# Patient Record
Sex: Male | Born: 2005 | Race: Black or African American | Hispanic: No | Marital: Single | Smoking: Current every day smoker
Health system: Southern US, Community
[De-identification: ages and names within clinical notes are randomized; demographics above are authoritative.]

## PROBLEM LIST (undated history)

## (undated) DIAGNOSIS — J45909 Unspecified asthma, uncomplicated: Secondary | ICD-10-CM

## (undated) HISTORY — PX: TONSILLECTOMY: SUR1361

---
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
## 2023-03-01 ENCOUNTER — Emergency Department: Payer: Medicaid Other

## 2023-03-01 ENCOUNTER — Other Ambulatory Visit: Payer: Self-pay

## 2023-03-01 ENCOUNTER — Emergency Department
Admission: EM | Admit: 2023-03-01 | Discharge: 2023-03-01 | Disposition: A | Discharge status: Home / Self Care | Payer: Medicaid Other | Source: Home / Self Care | Attending: Emergency Medicine | Admitting: Emergency Medicine

## 2023-03-01 DIAGNOSIS — M7989 Other specified soft tissue disorders: Secondary | ICD-10-CM | POA: Insufficient documentation

## 2023-03-01 DIAGNOSIS — M79671 Pain in right foot: Secondary | ICD-10-CM | POA: Diagnosis present

## 2023-03-01 MED ORDER — MELOXICAM 15 MG PO TABS: 15.0000 mg | ORAL_TABLET | Freq: Every day | ORAL | 0 refills | Status: AC

## 2023-03-01 NOTE — ED Triage Notes (Signed)
C/o waking up today and right foot pain and swelling. Denies injury.  Ambulatory. NAD

## 2023-03-01 NOTE — ED Provider Notes (Signed)
[facility] Provider Note    Event Date/Time   First MD Initiated Contact with Patient 03/01/23 1120     (approximate)   History   Foot Problem   HPI  Derrick Whitaker is a 17 y.o. male with no significant past medical history presents emergency department for right foot pain.  Patient states pain and swelling started last night.  No known injury.  States swelling is at the medial surface on the plantar side of the foot.      Physical Exam   Triage Vital Signs: ED Triage Vitals  Encounter Vitals Group     BP 03/01/23 1027 116/82     Systolic BP Percentile --      Diastolic BP Percentile --      Pulse Rate 03/01/23 1027 71     Resp 03/01/23 1027 16     Temp 03/01/23 1027 98.7 F (37.1 C)     Temp Source 03/01/23 1027 Oral     SpO2 03/01/23 1027 98 %     Weight 03/01/23 1028 154 lb (69.9 kg)     Height 03/01/23 1028 5\' 3"  (1.6 m)     Head Circumference --      Peak Flow --      Pain Score 03/01/23 1046 5     Pain Loc --      Pain Education --      Exclude from Growth Chart --     Most recent vital signs: Vitals:   03/01/23 1027  BP: 116/82  Pulse: 71  Resp: 16  Temp: 98.7 F (37.1 C)  SpO2: 98%     General: Awake, no distress.   CV:  Good peripheral perfusion. regular rate and  rhythm Resp:  Normal effort.  Abd:  No distention.   Other:  Right foot with swelling along the medial aspect and plantar surface, mostly in the arch, area is tender to palpation, no bruising noted   ED Results / Procedures / Treatments   Labs (all labs ordered are listed, but only abnormal results are displayed) Labs Reviewed - No data to display   EKG     RADIOLOGY X-ray of the right foot    PROCEDURES:   Procedures   MEDICATIONS ORDERED IN ED: Medications - No data to display   IMPRESSION / MDM / ASSESSMENT AND PLAN / ED COURSE  I reviewed the triage vital signs and the nursing notes.                              Differential  diagnosis includes, but is not limited to, sprain, cellulitis, tendinitis, fracture  Patient's presentation is most consistent with acute complicated illness / injury requiring diagnostic workup.   X-ray of the right foot   X-ray of the right foot independently reviewed interpreted by me as being negative for any acute abnormality  I did explain the findings to the patient.  He is to follow-up with podiatry.  He was placed in Ace wrap and a postop shoe.  Take meloxicam daily for pain and inflammation.  Patient is in agreement treatment plan.  He was discharged stable condition.   FINAL CLINICAL IMPRESSION(S) / ED DIAGNOSES   Final diagnoses:  Right foot pain     Rx / DC Orders   ED Discharge Orders          Ordered    meloxicam (MOBIC) 15 MG tablet  Daily  03/01/23 1238             Note:  This document was prepared using Dragon voice recognition software and may include unintentional dictation errors.    Faythe Ghee, PA-C 03/01/23 1242    Janith Lima, MD 03/02/23 [phone]

## 2023-03-01 NOTE — Discharge Instructions (Signed)
Follow-up podiatry.  Please call for an appointment.  Take medication as prescribed.  Return if worsening

## 2023-06-11 ENCOUNTER — Other Ambulatory Visit: Payer: Self-pay

## 2023-06-11 ENCOUNTER — Emergency Department: Payer: Medicaid Other

## 2023-06-11 ENCOUNTER — Emergency Department
Admission: EM | Admit: 2023-06-11 | Discharge: 2023-06-11 | Disposition: A | Discharge status: Home / Self Care | Payer: Medicaid Other | Attending: Emergency Medicine | Admitting: Emergency Medicine

## 2023-06-11 DIAGNOSIS — J45909 Unspecified asthma, uncomplicated: Secondary | ICD-10-CM | POA: Diagnosis not present

## 2023-06-11 DIAGNOSIS — F17290 Nicotine dependence, other tobacco product, uncomplicated: Secondary | ICD-10-CM | POA: Diagnosis not present

## 2023-06-11 DIAGNOSIS — R0789 Other chest pain: Secondary | ICD-10-CM | POA: Insufficient documentation

## 2023-06-11 LAB — CBC
HCT: 40.6 % (ref 39.0–52.0)
Hemoglobin: 14.5 g/dL (ref 13.0–17.0)
MCH: 29.9 pg (ref 26.0–34.0)
MCHC: 35.7 g/dL (ref 30.0–36.0)
MCV: 83.7 fL (ref 80.0–100.0)
Platelets: 410 10*3/uL — ABNORMAL HIGH (ref 150–400)
RBC: 4.85 MIL/uL (ref 4.22–5.81)
RDW: 12.7 % (ref 11.5–15.5)
WBC: 5.6 10*3/uL (ref 4.0–10.5)
nRBC: 0 % (ref 0.0–0.2)

## 2023-06-11 LAB — BASIC METABOLIC PANEL
Anion gap: 9 (ref 5–15)
BUN: 15 mg/dL (ref 6–20)
CO2: 24 mmol/L (ref 22–32)
Calcium: 9 mg/dL (ref 8.9–10.3)
Chloride: 104 mmol/L (ref 98–111)
Creatinine, Ser: 1.16 mg/dL (ref 0.61–1.24)
GFR, Estimated: >60 mL/min (ref >60)
Glucose, Bld: 127 mg/dL — ABNORMAL HIGH (ref 70–99)
Potassium: 3.5 mmol/L (ref 3.5–5.1)
Sodium: 137 mmol/L (ref 135–145)

## 2023-06-11 LAB — TROPONIN I (HIGH SENSITIVITY): Troponin I (High Sensitivity): 3 ng/L (ref <18)

## 2023-06-11 MED ORDER — NAPROXEN 500 MG PO TABS
500.0000 mg | ORAL_TABLET | Freq: Once | ORAL | Status: AC
  Administered 2023-06-11: 500 mg via ORAL
  Filled 2023-06-11: qty 1

## 2023-06-11 MED ORDER — NAPROXEN 500 MG PO TABS: 500.0000 mg | ORAL_TABLET | Freq: Two times a day (BID) | ORAL | 0 refills | Status: DC

## 2023-06-11 NOTE — ED Provider Notes (Signed)
[facility] Provider Note    Event Date/Time   First MD Initiated Contact with Patient 06/11/23 1803     (approximate)   History   Chest Pain   HPI  Derrick Whitaker is a 17 y.o. male   presents to the ED with complaint of chest pain for approximately 2 weeks off-and-on.  Patient states that pain increased with bending over to feed the dogs this morning.  He denies any shortness of breath and currently does not have any pain.  He does admit to smoking approximately 3 cigars/week.  No recreational drugs are used.  He has a history of asthma but does not use an inhaler.  No shortness of breath, indigestion, dizziness or difficulty breathing noted with his chest pain.      Physical Exam   Triage Vital Signs: ED Triage Vitals  Encounter Vitals Group     BP 06/11/23 1621 123/77     Systolic BP Percentile --      Diastolic BP Percentile --      Pulse Rate 06/11/23 1621 86     Resp 06/11/23 1621 12     Temp 06/11/23 1621 98.9 F (37.2 C)     Temp src --      SpO2 06/11/23 1621 98 %     Weight 06/11/23 1631 165 lb (74.8 kg)     Height 06/11/23 1631 5\' 3"  (1.6 m)     Head Circumference --      Peak Flow --      Pain Score 06/11/23 1630 4     Pain Loc --      Pain Education --      Exclude from Growth Chart --     Most recent vital signs: Vitals:   06/11/23 1621  BP: 123/77  Pulse: 86  Resp: 12  Temp: 98.9 F (37.2 C)  SpO2: 98%     General: Awake, no distress.  She is able to talk in complete sentences without any noticeable shortness of breath. CV:  Good peripheral perfusion.  Heart regular rate and rhythm. Resp:  Normal effort.  Lungs are clear bilaterally.  No point tenderness on palpation of the ribs or sternum.  No reproduction of pain with exertion of the upper extremities. Abd:  No distention.  Soft, nontender, bowel sounds present x 4 quadrants. Other:     ED Results / Procedures / Treatments   Labs (all labs ordered are listed, but  only abnormal results are displayed) Labs Reviewed  BASIC METABOLIC PANEL - Abnormal; Notable for the following components:      Result Value   Glucose, Bld 127 (*)    All other components within normal limits  CBC - Abnormal; Notable for the following components:   Platelets 410 (*)    All other components within normal limits  TROPONIN I (HIGH SENSITIVITY)     EKG  Vent. rate 84 BPM PR interval 134 ms QRS duration 82 ms QT/QTcB 318/375 ms P-R-T axes 67 34 52 Normal sinus rhythm with sinus arrhythmia Normal ECG When compared with ECG of 23-Nov-2022 15:10,    RADIOLOGY Chest x-ray images reviewed and interpreted by myself independent of the radiologist and was negative for acute cardiopulmonary changes.    PROCEDURES:  Critical Care performed:   Procedures   MEDICATIONS ORDERED IN ED: Medications  naproxen (NAPROSYN) tablet 500 mg (has no administration in time range)     IMPRESSION / MDM / ASSESSMENT AND PLAN / ED  COURSE  I reviewed the triage vital signs and the nursing notes.   Differential diagnosis includes, but is not limited to, chest wall pain, cardiac event, asthma,  pneumonia, musculoskeletal pain.  17 year old male presents to the ED with complaint of anterior chest wall pain intermittently for approximately 2 weeks.  Patient denies any injury or shortness of breath.  No over-the-counter medications have been taken.  Lab work including troponin was reassuring.  Chest x-ray and EKG as noted above.  I explained to patient that we would try an anti-inflammatory to see if this helps with his musculoskeletal pain.  He is also encouraged to follow-up with a PCP of his choice, urgent care and a list of clinics was on his discharge papers so that he can obtain a PCP.  He is return to the emergency department if any severe worsening of his symptoms.      Patient's presentation is most consistent with acute illness / injury with system symptoms.  FINAL  CLINICAL IMPRESSION(S) / ED DIAGNOSES   Final diagnoses:  Anterior chest wall pain     Rx / DC Orders   ED Discharge Orders          Ordered    naproxen (NAPROSYN) 500 MG tablet  2 times daily with meals        06/11/23 1848    Ambulatory Referral to Primary Care (Establish Care)        06/11/23 1856             Note:  This document was prepared using Dragon voice recognition software and may include unintentional dictation errors.   Derrick Rumps, PA-C 06/11/23 Derrick Bickers, MD 06/11/23 [phone]

## 2023-06-11 NOTE — Discharge Instructions (Addendum)
A list of primary care providers clinics were listed on your discharge papers and a referral was also made.  A prescription for naproxen was sent to the pharmacy for you begin taking twice a day with food.  Return to the emergency department if any severe worsening of your symptoms or urgent concerns.  Lab work, EKG and chest x-ray today does not show any obvious signs that this is heart related but should be followed up in office.  Please go to the following website to schedule new (and existing) patient appointments:   [email or web address]   The following is a list of primary care offices in the area who are accepting new patients at this time.  Please reach out to one of them directly and let them know you would like to schedule an appointment to follow up on an Emergency Department visit, and/or to establish a new primary care provider (PCP).  There are likely other primary care clinics in the are who are accepting new patients, but this is an excellent place to start:  [facility] Lead physician: Dr Shirlee Latch [address] [place], [place] [address] [phone]  [facility] Lead Physician: Dr Alba Cory [address], [place], [place] [address] [phone]  [facility]  Lead Physician: Dr Olevia Perches [address] [place], [place] [address] [phone]  [facility] Lead Physician: Dr Sofie Hartigan [address], [place], [place] [address] [phone]  [facility] Lead Physician: Dr Bari Edward [address] [place], [place], [place] [address] [phone]

## 2023-06-11 NOTE — ED Triage Notes (Signed)
Pt ambulatory to triage for CP which has been going on for about 1-2 weeks. States he bent over to feed dogs this morning and pain increased. Denies N/V. Hx of HTN.

## 2023-11-01 ENCOUNTER — Emergency Department

## 2023-11-01 ENCOUNTER — Emergency Department: Admission: EM | Admit: 2023-11-01 | Discharge: 2023-11-01 | Disposition: A | Discharge status: Home / Self Care

## 2023-11-01 ENCOUNTER — Other Ambulatory Visit: Payer: Self-pay

## 2023-11-01 DIAGNOSIS — M25522 Pain in left elbow: Secondary | ICD-10-CM | POA: Diagnosis not present

## 2023-11-01 DIAGNOSIS — M25531 Pain in right wrist: Secondary | ICD-10-CM | POA: Insufficient documentation

## 2023-11-01 DIAGNOSIS — H1132 Conjunctival hemorrhage, left eye: Secondary | ICD-10-CM | POA: Diagnosis not present

## 2023-11-01 MED ORDER — CYCLOBENZAPRINE HCL 10 MG PO TABS
5.0000 mg | ORAL_TABLET | Freq: Once | ORAL | Status: AC
  Administered 2023-11-01: 5 mg via ORAL
  Filled 2023-11-01: qty 1

## 2023-11-01 MED ORDER — CYCLOBENZAPRINE HCL 5 MG PO TABS: 5.0000 mg | ORAL_TABLET | Freq: Three times a day (TID) | ORAL | 0 refills | Status: DC | PRN

## 2023-11-01 MED ORDER — KETOROLAC TROMETHAMINE 30 MG/ML IJ SOLN
30.0000 mg | Freq: Once | INTRAMUSCULAR | Status: AC
  Administered 2023-11-01: 30 mg via INTRAMUSCULAR
  Filled 2023-11-01: qty 1
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
## 2023-11-01 NOTE — ED Notes (Signed)
 Pt back from CT. XR at bedside. Provided ice pack per request

## 2023-11-01 NOTE — ED Notes (Addendum)
 Pt to CT at this time via wheelchair.

## 2023-12-27 ENCOUNTER — Emergency Department
Admission: EM | Admit: 2023-12-27 | Discharge: 2023-12-27 | Disposition: A | Discharge status: Home / Self Care | Attending: Emergency Medicine | Admitting: Emergency Medicine

## 2023-12-27 ENCOUNTER — Other Ambulatory Visit: Payer: Self-pay

## 2023-12-27 DIAGNOSIS — M7918 Myalgia, other site: Secondary | ICD-10-CM | POA: Diagnosis not present

## 2023-12-27 DIAGNOSIS — M25531 Pain in right wrist: Secondary | ICD-10-CM | POA: Diagnosis present

## 2023-12-27 NOTE — Discharge Instructions (Addendum)
 A referral for a primary care provider was requested for outpatient services.  Someone from the hospital may call to get more information from you.  Also the name of the rheumatologist is listed on your discharge papers.  Call Monday to see when you can get an appointment.  At this time continue the meloxicam  that you have at home.  Discontinue the naproxen .

## 2023-12-27 NOTE — ED Triage Notes (Signed)
 Pt to ED POV for bilateral wrist pain since 6/13 and L elbow pain since sometimes before. Has been seen for same elbow pain before. Pt in NAD.

## 2023-12-27 NOTE — ED Provider Notes (Signed)
 [facility] Provider Note    Event Date/Time   First MD Initiated Contact with Patient 12/27/23 1305     (approximate)   History   Wrist Pain and Elbow Pain   HPI  Derrick Whitaker is a 18 y.o. male   presents to the ED with complaint of bilateral wrist pain since 12/25/2023 along with left elbow pain.  Patient denies any injury.  He also occasionally has some pain to his right ankle.  He has been seen in the emergency department for similar complaints and unknown 11/01/2023 he was seen in the emergency department at which time his right wrist and left elbow were x-rayed.  Patient states that Tylenol  helps with his pain for short period of time.  He is not aware of any family members with rheumatoid, connective tissue disease or other muscle diseases.      Physical Exam   Triage Vital Signs: ED Triage Vitals  Encounter Vitals Group     BP 12/27/23 1220 126/82     Girls Systolic BP Percentile --      Girls Diastolic BP Percentile --      Boys Systolic BP Percentile --      Boys Diastolic BP Percentile --      Pulse Rate 12/27/23 1220 84     Resp 12/27/23 1220 16     Temp 12/27/23 1220 98.2 F (36.8 C)     Temp Source 12/27/23 1220 Oral     SpO2 12/27/23 1220 97 %     Weight 12/27/23 1218 154 lb (69.9 kg)     Height 12/27/23 1218 5' 5 (1.651 m)     Head Circumference --      Peak Flow --      Pain Score 12/27/23 1220 3     Pain Loc --      Pain Education --      Exclude from Growth Chart --     Most recent vital signs: Vitals:   12/27/23 1220  BP: 126/82  Pulse: 84  Resp: 16  Temp: 98.2 F (36.8 C)  SpO2: 97%     General: Awake, no distress.  CV:  Good peripheral perfusion.  Heart regular rate and rhythm. Resp:  Normal effort.  Lungs are clear bilaterally. Abd:  No distention.  Other:  Bilateral wrist and hands were examined with no gross deformity, erythema or soft tissue edema.  Range of motion without crepitus and no restrictions  noted.  No erythema.  Radial pulses are present bilaterally.  No tenderness on palpation of the left epicondylar area, no effusion, no erythema.   ED Results / Procedures / Treatments   Labs (all labs ordered are listed, but only abnormal results are displayed) Labs Reviewed - No data to display   PROCEDURES:  Critical Care performed:   Procedures   MEDICATIONS ORDERED IN ED: Medications - No data to display   IMPRESSION / MDM / ASSESSMENT AND PLAN / ED COURSE  I reviewed the triage vital signs and the nursing notes.   Differential diagnosis includes, but is not limited to, recurrent joint pain without injury, musculoskeletal pain, rheumatoid, connective tissue disease.  18 year old male presents to the ED with recurrent wrist pain without history of injury along with left elbow pain.  Patient reports this been going on for more than 6 months and he has been here in the emergency department where x-rays have been obtained and no findings on x-ray.  He denies any injury  and has been taking Tylenol  with some minimal relief that is temporary.  I discussed a follow-up with rheumatology which he is agreeable to.  He also does not have a primary care provider and a referral was made.  He was given the name of the rheumatologist and agrees to call make an appointment.  He continues to have meloxicam  at home which she will continue taking.      Patient's presentation is most consistent with exacerbation of chronic illness.  FINAL CLINICAL IMPRESSION(S) / ED DIAGNOSES   Final diagnoses:  Musculoskeletal pain     Rx / DC Orders   ED Discharge Orders          Ordered    Ambulatory Referral to Primary Care (Establish Care)       Comments: Recurrent muscle skeletal pain.   12/27/23 1335             Note:  This document was prepared using Dragon voice recognition software and may include unintentional dictation errors.   Stafford Eagles, PA-C 12/27/23 1447    Shane Darling, MD 12/27/23 [phone]

## 2024-06-13 ENCOUNTER — Other Ambulatory Visit: Payer: Self-pay

## 2024-06-13 ENCOUNTER — Emergency Department: Admission: EM | Admit: 2024-06-13 | Discharge: 2024-06-13 | Disposition: A | Discharge status: Home / Self Care

## 2024-06-13 ENCOUNTER — Encounter: Payer: Self-pay | Admitting: Emergency Medicine

## 2024-06-13 DIAGNOSIS — R059 Cough, unspecified: Secondary | ICD-10-CM | POA: Diagnosis not present

## 2024-06-13 DIAGNOSIS — H1132 Conjunctival hemorrhage, left eye: Secondary | ICD-10-CM | POA: Insufficient documentation

## 2024-06-13 DIAGNOSIS — R1110 Vomiting, unspecified: Secondary | ICD-10-CM | POA: Insufficient documentation

## 2024-06-13 DIAGNOSIS — H5789 Other specified disorders of eye and adnexa: Secondary | ICD-10-CM | POA: Diagnosis present

## 2024-06-13 HISTORY — DX: Unspecified asthma, uncomplicated: J45.909

## 2024-06-13 NOTE — Discharge Instructions (Addendum)
 Your evaluation in the emergency department was reassuring, and your eye should return back to normal within the next couple weeks.  Please follow-up with a primary care doctor as needed, and return to the emergency department with any new or worsening symptoms.

## 2024-06-13 NOTE — ED Provider Notes (Signed)
   [facility] Provider Note    Event Date/Time   First MD Initiated Contact with Patient 06/13/24 2242     (approximate)   History   Eye Problem  Pt arrives W/ [facility] w/ c/o redness in bilateral eyes on sclera above the iris. Reports some pain in both eyes but not much. Gave himself a tattoo today on chest but nothing else unusual done today.    HPI Derrick Whitaker is a 18 y.o. male no significant past medical history presents for evaluation of redness in the left eye. - No pain.  No vision change. - Not on blood thinners -Did vomit about 2 days ago and has had mild cough recently, did sneeze a couple times today - no preceding     Physical Exam   Triage Vital Signs: ED Triage Vitals  Encounter Vitals Group     BP 06/13/24 2134 (!) 152/90     Girls Systolic BP Percentile --      Girls Diastolic BP Percentile --      Boys Systolic BP Percentile --      Boys Diastolic BP Percentile --      Pulse Rate 06/13/24 2134 (!) 109     Resp 06/13/24 2134 19     Temp 06/13/24 2134 98.5 F (36.9 C)     Temp Source 06/13/24 2134 Oral     SpO2 06/13/24 2134 100 %     Weight --      Height --      Head Circumference --      Peak Flow --      Pain Score 06/13/24 2135 3     Pain Loc --      Pain Education --      Exclude from Growth Chart --     Most recent vital signs: Vitals:   06/13/24 2230 06/13/24 2245  BP:    Pulse: 82 81  Resp:    Temp:    SpO2: 100% 99%     General: Awake, no distress.  HEENT: Normocephalic, atraumatic, +subconjunctival hemorrhage L eye.  CV:  Good peripheral perfusion. RRR, RP 2+ Resp:  Normal effort. CTAB   ED Results / Procedures / Treatments   Labs (all labs ordered are listed, but only abnormal results are displayed) Labs Reviewed - No data to display   EKG  N/a   RADIOLOGY N/a    PROCEDURES:  Critical Care performed: No  Procedures   MEDICATIONS ORDERED IN ED: Medications - No data to  display   IMPRESSION / MDM / ASSESSMENT AND PLAN / ED COURSE  I reviewed the triage vital signs and the nursing notes.                              DDX/MDM/AP: Differential diagnosis includes, but is not limited to, subconjunctival hemorrhage. No e/o trauma. No e/o acute pathology.   Plan: - reassurance  Patient's presentation is most consistent with acute, uncomplicated illness.        FINAL CLINICAL IMPRESSION(S) / ED DIAGNOSES   Final diagnoses:  Non-traumatic subconjunctival hemorrhage of left eye     Rx / DC Orders   ED Discharge Orders     None        Note:  This document was prepared using Dragon voice recognition software and may include unintentional dictation errors.   Clarine Ozell LABOR, MD 06/14/24 [phone]

## 2024-06-13 NOTE — ED Triage Notes (Addendum)
 Pt arrives W/ [facility] w/ c/o redness in bilateral eyes on sclera above the iris. Reports some pain in both eyes but not much. Gave himself a tattoo today on chest but nothing else unusual done today.
